# Patient Record
Sex: Female | Born: 1995 | Race: White | Hispanic: No | Marital: Single
Health system: Southern US, Community
[De-identification: ages and names within clinical notes are randomized; demographics above are authoritative.]

---

## 2021-04-10 ENCOUNTER — Emergency Department: Payer: Medicaid Other

## 2021-04-10 ENCOUNTER — Emergency Department
Admission: EM | Admit: 2021-04-10 | Discharge: 2021-04-10 | Disposition: A | Discharge status: Home / Self Care | Payer: Medicaid Other | Attending: Emergency Medicine | Admitting: Emergency Medicine

## 2021-04-10 ENCOUNTER — Other Ambulatory Visit: Payer: Self-pay

## 2021-04-10 DIAGNOSIS — S060X0A Concussion without loss of consciousness, initial encounter: Secondary | ICD-10-CM | POA: Diagnosis not present

## 2021-04-10 DIAGNOSIS — Y92410 Unspecified street and highway as the place of occurrence of the external cause: Secondary | ICD-10-CM | POA: Insufficient documentation

## 2021-04-10 DIAGNOSIS — S0990XA Unspecified injury of head, initial encounter: Secondary | ICD-10-CM | POA: Diagnosis present

## 2021-04-10 DIAGNOSIS — R079 Chest pain, unspecified: Secondary | ICD-10-CM | POA: Insufficient documentation

## 2021-04-10 MED ORDER — ONDANSETRON 4 MG PO TBDP
4.0000 mg | ORAL_TABLET | Freq: Once | ORAL | Status: AC
  Administered 2021-04-10: 4 mg via ORAL
  Filled 2021-04-10: qty 1

## 2021-04-10 MED ORDER — IBUPROFEN 600 MG PO TABS: 600.0000 mg | ORAL_TABLET | Freq: Three times a day (TID) | ORAL | 0 refills | Status: AC | PRN

## 2021-04-10 MED ORDER — HYDROCODONE-ACETAMINOPHEN 5-325 MG PO TABS
2.0000 | ORAL_TABLET | Freq: Once | ORAL | Status: AC
Start: 2021-04-10 — End: 2021-04-10
  Administered 2021-04-10: 2 via ORAL
  Filled 2021-04-10: qty 2

## 2021-04-10 MED ORDER — ONDANSETRON 4 MG PO TBDP: 4.0000 mg | ORAL_TABLET | Freq: Three times a day (TID) | ORAL | 0 refills | Status: AC | PRN
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
